# Patient Record
Sex: Female | Born: 1935 | Race: White | Hispanic: No | Marital: Married | Smoking: Never smoker
Health system: Southern US, Community
[De-identification: ages and names within clinical notes are randomized; demographics above are authoritative.]

## PROBLEM LIST (undated history)

## (undated) HISTORY — PX: ABDOMINAL HYSTERECTOMY: SHX81

## (undated) HISTORY — PX: ROTATOR CUFF REPAIR: SHX139

## (undated) HISTORY — PX: CHOLECYSTECTOMY: SHX55

## (undated) HISTORY — PX: REPLACEMENT TOTAL KNEE: SUR1224

---

## 2001-01-03 ENCOUNTER — Other Ambulatory Visit: Admission: RE | Admit: 2001-01-03 | Discharge: 2001-01-03 | Payer: Self-pay | Admitting: Obstetrics and Gynecology

## 2001-01-05 ENCOUNTER — Encounter: Payer: Self-pay | Admitting: Obstetrics and Gynecology

## 2001-01-05 ENCOUNTER — Encounter: Admission: RE | Admit: 2001-01-05 | Discharge: 2001-01-05 | Payer: Self-pay | Admitting: Obstetrics and Gynecology

## 2001-02-15 ENCOUNTER — Encounter (INDEPENDENT_AMBULATORY_CARE_PROVIDER_SITE_OTHER): Payer: Self-pay

## 2001-02-15 ENCOUNTER — Observation Stay (HOSPITAL_COMMUNITY): Admission: RE | Admit: 2001-02-15 | Discharge: 2001-02-16 | Payer: Self-pay | Admitting: Obstetrics and Gynecology

## 2001-05-16 ENCOUNTER — Encounter: Payer: Self-pay | Admitting: Orthopedic Surgery

## 2001-05-16 ENCOUNTER — Encounter: Admission: RE | Admit: 2001-05-16 | Discharge: 2001-05-16 | Payer: Self-pay | Admitting: Orthopedic Surgery

## 2002-01-07 ENCOUNTER — Other Ambulatory Visit: Admission: RE | Admit: 2002-01-07 | Discharge: 2002-01-07 | Payer: Self-pay | Admitting: Obstetrics and Gynecology

## 2003-04-08 ENCOUNTER — Other Ambulatory Visit: Admission: RE | Admit: 2003-04-08 | Discharge: 2003-04-08 | Payer: Self-pay | Admitting: Obstetrics and Gynecology

## 2003-12-08 ENCOUNTER — Encounter: Admission: RE | Admit: 2003-12-08 | Discharge: 2003-12-08 | Payer: Self-pay | Admitting: Orthopedic Surgery

## 2003-12-10 ENCOUNTER — Ambulatory Visit (HOSPITAL_COMMUNITY): Admission: RE | Admit: 2003-12-10 | Discharge: 2003-12-10 | Payer: Self-pay | Admitting: Orthopedic Surgery

## 2003-12-10 ENCOUNTER — Ambulatory Visit (HOSPITAL_BASED_OUTPATIENT_CLINIC_OR_DEPARTMENT_OTHER): Admission: RE | Admit: 2003-12-10 | Discharge: 2003-12-10 | Payer: Self-pay | Admitting: Orthopedic Surgery

## 2003-12-10 ENCOUNTER — Encounter (INDEPENDENT_AMBULATORY_CARE_PROVIDER_SITE_OTHER): Payer: Self-pay | Admitting: Specialist

## 2004-04-26 ENCOUNTER — Other Ambulatory Visit: Admission: RE | Admit: 2004-04-26 | Discharge: 2004-04-26 | Payer: Self-pay | Admitting: Obstetrics and Gynecology

## 2005-06-17 ENCOUNTER — Other Ambulatory Visit: Admission: RE | Admit: 2005-06-17 | Discharge: 2005-06-17 | Payer: Self-pay | Admitting: Obstetrics and Gynecology

## 2006-07-26 ENCOUNTER — Other Ambulatory Visit: Admission: RE | Admit: 2006-07-26 | Discharge: 2006-07-26 | Payer: Self-pay | Admitting: Obstetrics and Gynecology

## 2007-07-16 ENCOUNTER — Inpatient Hospital Stay (HOSPITAL_COMMUNITY): Admission: RE | Admit: 2007-07-16 | Discharge: 2007-07-19 | Payer: Self-pay | Admitting: Orthopedic Surgery

## 2007-11-13 ENCOUNTER — Other Ambulatory Visit: Admission: RE | Admit: 2007-11-13 | Discharge: 2007-11-13 | Payer: Self-pay | Admitting: Obstetrics and Gynecology

## 2010-01-05 ENCOUNTER — Ambulatory Visit (HOSPITAL_BASED_OUTPATIENT_CLINIC_OR_DEPARTMENT_OTHER): Admission: RE | Admit: 2010-01-05 | Discharge: 2010-01-05 | Payer: Self-pay | Admitting: Orthopedic Surgery

## 2011-02-28 LAB — POCT HEMOGLOBIN-HEMACUE: Hemoglobin: 13.7 g/dL (ref 12.0–15.0)

## 2011-04-26 NOTE — Op Note (Signed)
NAME:  Tina Espinoza, Tina Espinoza            ACCOUNT NO.:  [id number]   MEDICAL RECORD NO.:  [id number]          PATIENT TYPE:  INP   LOCATION:  0003                         FACILITY:  [facility]   PHYSICIAN:  John L. Rendall, M.D.  DATE OF BIRTH:  03/18/1918   DATE OF PROCEDURE:  07/16/2007  DATE OF DISCHARGE:                               OPERATIVE REPORT   PREOPERATIVE DIAGNOSIS:  Osteoarthritis, right knee with significant  varus deformity.   SURGICAL PROCEDURES:  Right total knee arthroplasty with computer  navigation assistance.   POSTOPERATIVE DIAGNOSIS:  Osteoarthritis, right knee with significant  varus deformity.   SURGEON:  John L. Rendall, M.D.   ASSISTANT:  Rexene Edison, [facility]   ANESTHESIA:  General with femoral nerve block.   PATHOLOGY:  The patient has bone against bone medial compartment, right  knee with chronic pain unremitting despite conservative measures.  She  has had pain to the point where it interferes with activities of daily  living and she has 7 degrees of varus deformity determined at surgery.   PROCEDURE:  Under general anesthesia the right leg was prepared with  DuraPrep and draped as a sterile field with sterile tourniquet on the  proximal thigh.  Leg was wrapped with the Esmarch and the tourniquet is  elevated at 300 mm.  Midline incision is made, carried medial  parapatellar deep, the patella was everted.  The femur was sized at a  standard.  The knee is debrided in preparation for computer mapping.  Schanz pins were then placed in the proximal and medial tibia and the  distal medial femur  At this point the arrays were set up, femoral head  and malleoli of the ankles were identified.  Mapping of the proximal  tibia and distal femur is then completed.  Varus deformity of 7 degrees  is documented.  Full extension is possible.  Using the first guide the  proximal tibial resection was carried out within 1 degree of anatomic  accuracy.  Tensioning is then done and  tensioning is used to get the  knee ligaments within 1 degree of anatomic accuracy.  Once this was  completed, anterior and posterior flare of the distal femur are resected  without notching.  Distal femoral cut was made.  The ligaments were  balanced at approximately 12.5 mm.  Lamina spreader was then inserted  and remnants of the cruciates and the menisci were resected plus spurs  were taken off on the back of the femoral condyle.  Attention was then  turned to the recessing guide and once this was completed, the proximal  tibia was exposed.  Center peg hole with keel is inserted for a #2.  Trial reduction then of a #2 tibia with the 12.5 bearing and standard  femoral component reveals excellent fit, alignment and stability.  Patella was then osteotomized and three peg patella was inserted.  Range  of motion is good with the trial components in place anatomic alignment  is within 0.5 degrees of anatomic perfection and within 0.5 mm of total  balance in flexion at 90 degrees.  Stability of the knee ligaments  is  excellent just with two towel clips on the capsule.  At this point  permanent components were obtained and cemented in place after  irrigation of the joint surfaces.  The synovectomy was carried out while  the cement hardened.  The tourniquet is let down at about an hour and  five minutes.  Multiple  small vessels were cauterized, excess cement was removed.  The knee was  then closed in layers with #1 Tycron, #1 Vicryl, 2-0 Vicryl and skin  clips.  Total operative time about an hour and 20 minutes.  The patient  returned to recovery in good condition.  Excellent position and  alignment of the leg was felt to have been obtained.      John L. Rendall, M.D.  Electronically Signed     JLR/MEDQ  D:  07/16/2007  T:  07/16/2007  Job:  [id number]

## 2011-04-29 NOTE — Discharge Summary (Signed)
[facility]  Patient:    Tina Espinoza, Tina Espinoza                   MRN: [id number] Adm. Date:  [id number] Disc. Date: [id number] Attending:  Wandalee Ferdinand                           Discharge Summary  DISCHARGE DIAGNOSES:          Symptomatic cystocele.  PROCEDURE:                    Anterior colporrhaphy.  CONSULTATIONS:                None.  DISCHARGE MEDICATIONS:        Analgesics.  HISTORY AND PHYSICAL:         This is an 77 year old gravida 5, para 3 who presented complaining of symptoms of pelvic relaxation.  She was ultimately found to have a symptomatic cystocele and minimal rectocele.  She was hence brought in for repair.  HOSPITAL COURSE:              Patient was admitted to [facility] on February 15, 2001.  On the same day she was taken to the operating room and underwent anterior colporrhaphy and suprapubic catheter placement. The procedure was uncomplicated.  Her postoperative course was benign and she was discharged on the first postoperative day afebrile and in satisfactory condition.  LABORATORIES:                 Hemoglobin and hematocrit on admission were 14.1 and 40.2 respectively.  Repeat values obtained February 16, 2001 revealed hemoglobin 10.5, hematocrit 30.8 respectively.  Routine chemistries were negative.  Type and Rh was done but the results are equivocal in the chart. Urinalysis was negative.  DISPOSITION:                  Patient is to return to [facility] in four to six weeks for postoperative evaluation. DD:  04/02/01 TD:  04/02/01 Job: [id number]

## 2011-04-29 NOTE — Op Note (Signed)
[facility]  Patient:    Tina Espinoza, Tina Espinoza                   MRN: [id number] Proc. Date: 02/15/01 Adm. Date:  [id number] Attending:  Wandalee Ferdinand                           Operative Report  PREOPERATIVE DIAGNOSIS:       Fourth degree cystocele.  POSTOPERATIVE DIAGNOSIS:      Fourth degree cystocele.  OPERATION:                    1. Anterior colporrhaphy.                               2. Placement of suprapubic catheter.  SURGEON:                      Rudy Jew. Ashley Royalty, M.D.  ASSISTANT:                    Georgina Peer, M.D.  ANESTHESIA:                   General.  ESTIMATED BLOOD LOSS:         100 cc.  COMPLICATIONS:                None.  PACKS AND DRAINS:             Vaginal pack and suprapubic catheter.  DESCRIPTION OF PROCEDURE:     The patient was taken to the operating room and placed in the dorsal supine position.  After adequate general anesthesia was administered, she was placed in the lithotomy position and prepped and draped in the usual manner for vaginal surgery.  Foley catheter was placed. Posterior weighted retractor was placed per vagina, and the apex of the vagina was identified and grasped with Allis clamps.  The patient was noted to have a fourth degree cystocele and virtually no rectocele at present.  The vaginal apex seemed reasonably well supported.  The Allis clamps were moved inferiorly and the vaginal mucosa between them incised longitudinally to within approximately 2 cm from the urethral meatus.  The mucosa was then separated from the underlying pubovesical fascia in order to release the cystocele medially for later imbrication.  This was accomplished bilaterally without difficulty.  Dilute methylene blue was instilled into the Foley catheter to make sure that the bladder was in fact intact.  There was no spillage whatsoever.  The cystocele was then imbricated with 2-0 Vicryl in pursestring fashion  and later using interrupted sutures.  The excess vaginal mucosa was excised and submitted to pathology for histologic studies.  The mucosa was then closed with 2-0 Vicryl in a running locking fashion.  Careful attention was paid to pick up some pubovesical fascia in the same bites to help obliterate dead space.  Hemostasis was noted.  Next, the bladder was filled with sterile water via the Foley catheter.  A Bonnano suprapubic catheter was introduced into the bladder and sutured in place with 3-0 nylon sutures.  The Foley catheter was removed and the vagina packed with 1 inch Iodoform gauze.  At this point, hemostasis was noted and the procedure terminated.  The patient tolerated the procedure extremely well and was returned to the recovery room in good  condition. DD:  02/15/01 TD:  02/16/01 Job: [id number]

## 2011-04-29 NOTE — Discharge Summary (Signed)
NAME:  BRODY, KUMP            ACCOUNT NO.:  [id number]   MEDICAL RECORD NO.:  [id number]          PATIENT TYPE:  INP   LOCATION:  1606                         FACILITY:  [facility]   PHYSICIAN:  John L. Rendall, M.D.  DATE OF BIRTH:  04-20-1918   DATE OF ADMISSION:  07/16/2007  DATE OF DISCHARGE:  07/19/2007                               DISCHARGE SUMMARY   ADMITTING DIAGNOSES:  1. End-stage osteoarthritis.  2. Dyslipidemia.   DISCHARGE DIAGNOSES:  1. Status post right total knee arthroplasty.  2. Dyslipidemia.  3. Acute blood loss anemia requiring blood transfusion.  4. Hypoxia secondary to acute blood loss anemia.   HISTORY OF PRESENT ILLNESS:  Ms. Hernan is an 77 year old white  female with right knee pain for approximately 8-12 years.  No injuries,  no surgery.  Constant pain.  Has failed conservative treatment.  Worse  with ambulation, mechanical symptoms, positive for giving way.  No  assistive device.  The patient does have waking pain.  The patient was  admitted to undergo right total knee arthroplasty with computer  navigation.   MEDICATIONS:  1. Estrace 1 mg two to three times a week, taken last July 12, 2007.  2. Simvastatin 20 mg one q.p.m.  3. Tramadol one p.o. q.p.m.  4. Darvocet-N 100 one q.4-6h. p.r.n.  5. Aspirin 81 mg one daily, stopped July 12, 2007.  6. Centrum Silver one daily.  7. Multivitamin one daily.  8. MiraLax or Metamucil daily.   ALLERGIES:  1. VIOXX.  2. LYRICA.  3. FLONASE.  4. MOXIFLOXACIN.   SURGICAL PROCEDURE:  The patient was taken to the operating room on  July 16, 2007, by Dr. Erasmo Leventhal, assisted by Rexene Edison, PA-C.  The  patient was placed under general anesthesia, received supplemental  femoral nerve block and then underwent a right total knee arthroplasty  with computer navigation.  The following components were inserted:  A  size #2 tibial tray; standard right femoral component; a metal-back  patella, standard; and a  12.5-mm poly insert.  The patient tolerated the  procedure well and returned to recovery in good stable condition.   CONSULTS:  The following consults were obtained while the patient was  hospitalized:  PT, OT, case management.   HOSPITAL COURSE:  Postoperative day #1 the patient afebrile, vital signs  were stable.  Denied chest pain, shortness of breath, nausea, vomiting.  H&H was 9.2 and 26.5.  Repeat CBC was ordered and later that day H&H was  8.9 and 25.5.  Postoperative day #2, the patient afebrile and vital  signs stable.  Unable to wean off oxygen secondary to patient  desaturating on room air.  The patient's H&H was 8.9 and 25.5, was given  1 unit of packed red blood cells.  Postoperative day #3, the patient  afebrile, vital signs were stable.  H&H improved to 10.3 and 30.0.  Hypoxia was resolved.  The patient was 93% on room air.  The patient  progressing well with physical therapy and was later discharged home in  good, stable condition.   LABORATORIES:  Routine labs on admission:  CBC  all values within normal  limits.  Coags on admission:  PT was 12.8, INR 1.0, PTT was high at 42.  Routine chemistries on admission:  Sodium 141, potassium 5.0, chloride  103, bicarb 31, glucose was elevated at 132, BUN was 12, creatinine  0.79.  Hepatic enzymes on admission:  All values within normal limits.  UA on admission had a few bacteria and 3-6 white blood cells, epithelial  cells were few, leukocyte esterase was moderate, negative for nitrates,  negative for protein.  Repeat UA on admission dated July 16, 2007:  White blood cells 3-6, bacteria none. Urine culture on July 16, 2007,  had no growth.   DISCHARGE INSTRUCTIONS:  1. Diet:  No restrictions.  2. Wound care:  The patient to change dressing daily, call if any      signs of infection.  The patient may shower on Thursday after      discharge.  3. Activity:  The patient weightbearing as tolerated.   SPECIAL INSTRUCTIONS:  CPM  0-90 degrees 6-8 hours a day.   FOLLOWUP:  Needs followup with Dr. Priscille Kluver in the office on July 31, 2007.  The patient is to call office at [phone] for appointment.   MEDICATIONS:  1. Arixtra 2.5 mg one injection daily at 8 a.m. as instructed, last      dose July 23, 2007.  Begin baby aspirin 81 mg on July 24, 2007,      daily for 1 month.  2. Norco 5/325 one to two q.4h. as needed for pain.  3. Resume Estrace 1 mg two to three weekly.  4. Simvastatin 20 mg one p.o. q.p.m.  5. Centrum Silver one p.o. daily.  6. Multivitamin one p.o. daily.  7. MiraLax or Metamucil daily as needed.   CONDITION ON DISCHARGE:  The patient was in good, stable condition at  time of discharge to home.      Richardean Canal, P.A.      John L. Rendall, M.D.  Electronically Signed    GC/MEDQ  D:  07/31/2007  T:  07/31/2007  Job:  [id number]

## 2011-04-29 NOTE — Op Note (Signed)
NAME:  Tina Espinoza, Tina Espinoza                      ACCOUNT NO.:  [id number]   MEDICAL RECORD NO.:  [id number]                   PATIENT TYPE:  AMB   LOCATION:  DSC                                  FACILITY:  [facility]   PHYSICIAN:  Cindee Salt, M.D.                    DATE OF BIRTH:  03-18-1918   DATE OF PROCEDURE:  12/10/2003  DATE OF DISCHARGE:                                 OPERATIVE REPORT   PREOPERATIVE DIAGNOSIS:  Mucoid cyst, left middle finger.   POSTOPERATIVE DIAGNOSIS:  Mucoid cyst, left middle finger.   OPERATION:  Excision of mucoid cyst, debridement of distal interphalangeal  joint, left middle finger.   SURGEON:  Cindee Salt, M.D.   ASSISTANT:  Carolyne Fiscal   ANESTHESIA:  Forearm-based IV regional.   HISTORY:  The patient is an 77 year old female with a history of a mucoid  cyst, left middle finger.  This has been present for a considerable period  of time with a large groove of her nail plate to the distal margin.   PROCEDURE:  The patient was brought to the operating room where a forearm-  based IV regional anesthetic was carried out without difficulty.  She was  prepped using Duraprep, supine position, left arm free.  A curvilinear  incision was made over the distal interphalangeal joint, carried down  through subcutaneous tissue.  Bleeders were electrocauterized.  The cystic  structure was immediately apparent on the ulnar aspect, distal  interphalangeal joint.  This was traced distally beneath the skin, removing  a large cystic mass.  A partially deflated mass was present distally.  Care  was taken to protect the nail matrix.  The joint was opened.  A debridement  was performed of small osteophytes primarily on the ulnar aspect.   The wound was copiously irrigated with saline and skin closed with  interrupted 5-0 nylon sutures.  Specimen was sent to pathology.  A sterile  compressive dressing and splint for the finger were applied.  The patient  tolerated the procedure  well and was taken to the recovery room for  observation in satisfactory condition.  She is discharged home to return to  the [facility] [place] in one week on Vicodin and Septra-DS.                                               Cindee Salt, M.D.    GK/MEDQ  D:  12/10/2003  T:  12/10/2003  Job:  [id number]

## 2011-04-29 NOTE — H&P (Signed)
[facility]  Patient:    Tina Espinoza                     MRN: [id number] Adm. Date:  02/26/01 Attending:  Guy Sandifer. Arleta Creek, M.D.                         History and Physical  CHIEF COMPLAINT:              Uterine fibroids.  HISTORY OF PRESENT ILLNESS:   This patient is a 77 year old, married, black female, G3, P3, status post tubal ligation, who has had known uterine leiomyomata over the last 2 years.  However, she has had increasingly heavy flows and has recently been diagnosed with anemia which was treated by Dr. Catha Espinoza.  On examination, the uterus is approximately 14 weeks in size and irregular in contour.  Pelvic ultrasound reveals the uterus measuring 15 x 9 x 7.2 cm, consistent with uterine leiomyomata.  Ovaries not visualized bilaterally. After a careful discussion of the options, the patient is scheduled for total abdominal hysterectomy and removal of an ovary only if distinctly abnormal.  PAST MEDICAL HISTORY:         1. The patient has twins.                               2. Chronic hypertension.                               3. Chronic proteinuria.  OBSTETRIC HISTORY:            Cesarean section x 2.  TAB x 1.  MEDICATIONS:                  1. Maxzide daily.                               2. Allegra p.r.n.                               3. Iron and vitamins daily.  ALLERGIES:                    No known drug allergies.  SOCIAL HISTORY:               The patient denies tobacco, alcohol, or drug abuse.  FAMILY HISTORY:               Type 2 diabetes in mother and father.  Chronic hypertension in mother.  REVIEW OF SYSTEMS:            Negative except as above.  PHYSICAL EXAMINATION:  GENERAL:                      Height 5 feet zero inches.  Weight 191 pounds.  VITAL SIGNS:                  Blood pressure 140/90.  HEENT:                        Without thyromegaly.  LUNGS:  Clear to  auscultation.  HEART:                        Regular rate and rhythm.  BACK:                         No CVA tenderness.  BREASTS:                      Without mass, retraction, or discharge.  ABDOMEN:                      Obese, soft with a well-healed infraumbilical midline scar and a pelvic mass arising from the midline in the pelvis to approximately one-half the distance to the umbilicus.  PELVIC:                       Vulva, vagina, and cervix without lesion. Uterus is 14 weeks in size, irregular in contour, mobile, nontender. Adnexal exam is essentially completely compromised.  EXTREMITIES:                  Grossly within normal limits.  NEUROLOGIC:                   Grossly within normal limits.  ASSESSMENT:                   Uterine leiomyomata.  PLAN:                         Total abdominal hysterectomy; removal of an ovary if abnormal. DD:  02/14/01 TD:  02/14/01 Job: [id number]

## 2011-09-26 LAB — CBC
HCT: 25.5 — ABNORMAL LOW
Hemoglobin: 10.3 — ABNORMAL LOW
Hemoglobin: 8.9 — ABNORMAL LOW
Hemoglobin: 9.2 — ABNORMAL LOW
MCHC: 34.8
MCHC: 34.8
MCV: 90.8
Platelets: 245
RBC: 2.81 — ABNORMAL LOW
RBC: 2.95 — ABNORMAL LOW
RBC: 3.32 — ABNORMAL LOW
RDW: 12.6
RDW: 13.7
RDW: 11.9
WBC: 5.5

## 2011-09-26 LAB — BASIC METABOLIC PANEL
CO2: 29
CO2: 30
Calcium: 7.6 — ABNORMAL LOW
Calcium: 7.7 — ABNORMAL LOW
Calcium: 8 — ABNORMAL LOW
Creatinine, Ser: 0.64
Creatinine, Ser: 0.71
GFR calc Af Amer: >60
GFR calc Af Amer: >60
GFR calc non Af Amer: >60
GFR calc non Af Amer: >60
Glucose, Bld: 142 — ABNORMAL HIGH
Glucose, Bld: 97
Sodium: 134 — ABNORMAL LOW
Sodium: 136

## 2011-09-26 LAB — URINALYSIS, ROUTINE W REFLEX MICROSCOPIC
Bilirubin Urine: NEGATIVE
Hgb urine dipstick: NEGATIVE
Nitrite: NEGATIVE
Nitrite: NEGATIVE
Specific Gravity, Urine: 1.021
Specific Gravity, Urine: 1.019
Urobilinogen, UA: 0.2
Urobilinogen, UA: 0.2

## 2011-09-26 LAB — URINE CULTURE
Colony Count: NO GROWTH
Colony Count: NO GROWTH
Culture: NO GROWTH
Culture: NO GROWTH
Special Requests: NEGATIVE

## 2011-09-26 LAB — DIFFERENTIAL
Basophils Absolute: 0
Basophils Relative: 1
Eosinophils Relative: 3
Lymphocytes Relative: 32
Monocytes Absolute: 0.5
Monocytes Relative: 9
Neutro Abs: 3

## 2011-09-26 LAB — COMPREHENSIVE METABOLIC PANEL
AST: 22
Albumin: 3.5
Alkaline Phosphatase: 44
BUN: 12
Chloride: 103
GFR calc Af Amer: >60
Potassium: 5
Total Bilirubin: 0.4
Total Protein: 6.5

## 2011-09-26 LAB — URINE MICROSCOPIC-ADD ON

## 2011-09-26 LAB — CROSSMATCH

## 2011-09-26 LAB — APTT: aPTT: 42 — ABNORMAL HIGH

## 2011-09-26 LAB — ABO/RH: ABO/RH(D): A POS

## 2017-04-20 ENCOUNTER — Emergency Department (HOSPITAL_COMMUNITY)
Admission: EM | Admit: 2017-04-20 | Discharge: 2017-04-21 | Disposition: A | Discharge status: Home / Self Care | Payer: Medicare Other | Attending: Emergency Medicine | Admitting: Emergency Medicine

## 2017-04-20 ENCOUNTER — Encounter (HOSPITAL_COMMUNITY): Payer: Self-pay | Admitting: Emergency Medicine

## 2017-04-20 DIAGNOSIS — K224 Dyskinesia of esophagus: Secondary | ICD-10-CM | POA: Diagnosis not present

## 2017-04-20 DIAGNOSIS — R112 Nausea with vomiting, unspecified: Secondary | ICD-10-CM

## 2017-04-20 DIAGNOSIS — K2970 Gastritis, unspecified, without bleeding: Secondary | ICD-10-CM | POA: Insufficient documentation

## 2017-04-20 DIAGNOSIS — Z96653 Presence of artificial knee joint, bilateral: Secondary | ICD-10-CM | POA: Diagnosis not present

## 2017-04-20 DIAGNOSIS — K219 Gastro-esophageal reflux disease without esophagitis: Secondary | ICD-10-CM | POA: Insufficient documentation

## 2017-04-20 DIAGNOSIS — R1013 Epigastric pain: Secondary | ICD-10-CM | POA: Diagnosis not present

## 2017-04-20 DIAGNOSIS — R1110 Vomiting, unspecified: Secondary | ICD-10-CM | POA: Diagnosis present

## 2017-04-20 LAB — CBC WITH DIFFERENTIAL/PLATELET
Basophils Absolute: 0 10*3/uL (ref 0.0–0.1)
Basophils Relative: 0 %
EOS PCT: 2 %
Eosinophils Absolute: 0.1 10*3/uL (ref 0.0–0.7)
HCT: 42.8 % (ref 36.0–46.0)
Hemoglobin: 13.8 g/dL (ref 12.0–15.0)
LYMPHS ABS: 1.1 10*3/uL (ref 0.7–4.0)
Lymphocytes Relative: 13 %
MCH: 29.9 pg (ref 26.0–34.0)
MCHC: 32.2 g/dL (ref 30.0–36.0)
MCV: 92.8 fL (ref 78.0–100.0)
MONO ABS: 0.5 10*3/uL (ref 0.1–1.0)
MONOS PCT: 6 %
Neutro Abs: 6.6 10*3/uL (ref 1.7–7.7)
Neutrophils Relative %: 79 %
PLATELETS: 227 10*3/uL (ref 150–400)
RBC: 4.61 MIL/uL (ref 3.87–5.11)
RDW: 13.1 % (ref 11.5–15.5)
WBC: 8.4 10*3/uL (ref 4.0–10.5)

## 2017-04-20 LAB — URINALYSIS, ROUTINE W REFLEX MICROSCOPIC
Bilirubin Urine: NEGATIVE
Glucose, UA: NEGATIVE mg/dL
Hgb urine dipstick: NEGATIVE
Ketones, ur: 20 mg/dL — AB
Nitrite: NEGATIVE
Protein, ur: 30 mg/dL — AB
Specific Gravity, Urine: 1.023 (ref 1.005–1.030)
pH: 5 (ref 5.0–8.0)

## 2017-04-20 LAB — COMPREHENSIVE METABOLIC PANEL
ALK PHOS: 41 U/L (ref 38–126)
ALT: 13 U/L — ABNORMAL LOW (ref 14–54)
ANION GAP: 10 (ref 5–15)
AST: 22 U/L (ref 15–41)
Albumin: 4.1 g/dL (ref 3.5–5.0)
BUN: 18 mg/dL (ref 6–20)
CALCIUM: 9.4 mg/dL (ref 8.9–10.3)
CHLORIDE: 103 mmol/L (ref 101–111)
CO2: 26 mmol/L (ref 22–32)
Creatinine, Ser: 0.97 mg/dL (ref 0.44–1.00)
GFR calc non Af Amer: 48 mL/min — ABNORMAL LOW (ref >60)
GFR, EST AFRICAN AMERICAN: 55 mL/min — AB (ref >60)
Glucose, Bld: 132 mg/dL — ABNORMAL HIGH (ref 65–99)
POTASSIUM: 3.8 mmol/L (ref 3.5–5.1)
Sodium: 139 mmol/L (ref 135–145)
Total Bilirubin: 0.4 mg/dL (ref 0.3–1.2)
Total Protein: 7.2 g/dL (ref 6.5–8.1)

## 2017-04-20 LAB — LIPASE, BLOOD: Lipase: 24 U/L (ref 11–51)

## 2017-04-20 NOTE — ED Triage Notes (Signed)
Pt c/o vomiting since 12pm today.  Pt st's she is not having any nausea but has epigastric pain prior to vomiting and then pain stops.  Pt st's unable to keep anything down

## 2017-04-20 NOTE — ED Provider Notes (Signed)
[facility]-EMERGENCY DEPT Provider Note   CSN: [id number] Arrival date & time: 04/20/17  1955     History   Chief Complaint Chief Complaint  Patient presents with  . Emesis    HPI Tina Espinoza is a 83 y.o. female with a PMHx of hiatal hernia, GERD, esophageal spasm, HTN, HLD, CKD3, lumbar spinal stenosis, and osteoarthritis, and PSHx of cholecystectomy and hysterectomy, who presents to the ED with complaints of nausea and vomiting that occurred around lunch time at approximately 12:30 PM today. Patient states that she was eating soup with noodles and a small piece of chicken and after approximately 4-5 by she began throwing up. She states something similar has happened in the past, ~3 times total, and her PCP last year suspected that it was due to esophageal spasm. He stated however that if it continued to occur she would need to consider having an EGD done. She has not had this done yet. She states she's had approximately 12 episodes of nonbloody nonbilious emesis consisting of undigested food and clear liquid, the last emesis was around 7:15 PM. Patient and her family state that they could hear gurgling in her throat right before she vomited. After vomiting she developed some epigastric pain that she describes as initially 10/10 although now resolved, intermittent throbbing nonradiating epigastric pain worse with vomiting and with no treatments tried prior to arrival. She was seen at [facility] urgent care in [place], and the physician (Dr. Earlene Plater) recommended that she come here for further evaluation. Denies sensation of food impaction.   She denies fevers, chills, CP, SOB, diarrhea/constipation, obstipation, melena, hematochezia, hematemesis, hematuria, dysuria, myalgias, arthralgias, numbness, tingling, focal weakness, or any other complaints at this time. Denies recent travel, sick contacts, suspicious food intake, EtOH use, or regular NSAID use. Doesn't take zantac regularly.    The  history is provided by the patient and medical records. No language interpreter was used.  Emesis   This is a new problem. The current episode started 6 to 12 hours ago. The problem occurs more than 10 times per day. The problem has been resolved. The emesis has an appearance of stomach contents. There has been no fever. Associated symptoms include abdominal pain. Pertinent negatives include no arthralgias, no chills, no diarrhea, no fever and no myalgias.    History reviewed. No pertinent past medical history.  There are no active problems to display for this patient.   Past Surgical History:  Procedure Laterality Date  . ABDOMINAL HYSTERECTOMY    . CHOLECYSTECTOMY    . REPLACEMENT TOTAL KNEE    . ROTATOR CUFF REPAIR      OB History    No data available       Home Medications    Prior to Admission medications   Not on File    Family History No family history on file.  Social History Social History  Substance Use Topics  . Smoking status: Never Smoker  . Smokeless tobacco: Never Used  . Alcohol use No     Allergies   Patient has no allergy information on record.   Review of Systems Review of Systems  Constitutional: Negative for chills and fever.  Respiratory: Negative for shortness of breath.   Cardiovascular: Negative for chest pain.  Gastrointestinal: Positive for abdominal pain, nausea and vomiting. Negative for blood in stool, constipation and diarrhea.  Genitourinary: Negative for dysuria and hematuria.  Musculoskeletal: Negative for arthralgias and myalgias.  Skin: Negative for color change.  Allergic/Immunologic: Negative  for immunocompromised state.  Neurological: Negative for weakness and numbness.  Psychiatric/Behavioral: Negative for confusion.   All other systems reviewed and are negative for acute change except as noted in the HPI.    Physical Exam Updated Vital Signs BP (!) 164/90 (BP Location: Left Wrist)   Pulse 98   Temp 98.1 F (36.7  C) (Oral)   Resp 17   Ht 5\' 2"  (1.575 m)   Wt 59.6 kg   SpO2 96%   BMI 24.03 kg/m   Physical Exam  Constitutional: She is oriented to person, place, and time. Vital signs are normal. She appears well-developed and well-nourished.  Non-toxic appearance. No distress.  Afebrile, nontoxic, NAD, appears much younger than stated age  HENT:  Head: Normocephalic and atraumatic.  Mouth/Throat: Oropharynx is clear and moist and mucous membranes are normal.  Eyes: Conjunctivae and EOM are normal. Right eye exhibits no discharge. Left eye exhibits no discharge.  Neck: Normal range of motion. Neck supple.  Cardiovascular: Normal rate, regular rhythm, normal heart sounds and intact distal pulses.  Exam reveals no gallop and no friction rub.   No murmur heard. Pulmonary/Chest: Effort normal and breath sounds normal. No respiratory distress. She has no decreased breath sounds. She has no wheezes. She has no rhonchi. She has no rales.  Abdominal: Soft. Normal appearance and bowel sounds are normal. She exhibits no distension. There is tenderness in the right upper quadrant, epigastric area and left upper quadrant. There is no rigidity, no rebound, no guarding, no CVA tenderness, no tenderness at McBurney's point and negative Murphy's sign.  Soft, nondistended, +BS throughout, with very mild upper abd TTP, no r/g/r, neg murphy's, neg mcburney's, no CVA TTP   Musculoskeletal: Normal range of motion.  Neurological: She is alert and oriented to person, place, and time. She has normal strength. No sensory deficit.  Skin: Skin is warm, dry and intact. No rash noted.  Psychiatric: She has a normal mood and affect.  Nursing note and vitals reviewed.    ED Treatments / Results  Labs (all labs ordered are listed, but only abnormal results are displayed) Labs Reviewed  COMPREHENSIVE METABOLIC PANEL - Abnormal; Notable for the following:       Result Value   Glucose, Bld 132 (*)    ALT 13 (*)    GFR calc  non Af Amer 48 (*)    GFR calc Af Amer 55 (*)    All other components within normal limits  URINALYSIS, ROUTINE W REFLEX MICROSCOPIC - Abnormal; Notable for the following:    APPearance HAZY (*)    Ketones, ur 20 (*)    Protein, ur 30 (*)    Leukocytes, UA SMALL (*)    Bacteria, UA RARE (*)    Squamous Epithelial / LPF 0-5 (*)    All other components within normal limits  URINE CULTURE  CBC WITH DIFFERENTIAL/PLATELET  LIPASE, BLOOD  I-STAT TROPOININ, ED    EKG  EKG Interpretation  Date/Time:  Friday Apr 21 2017 00:40:19 EDT Ventricular Rate:  102 PR Interval:    QRS Duration: 147 QT Interval:  363 QTC Calculation: 473 R Axis:   -117 Text Interpretation:  Sinus tachycardia RBBB and LAFB No acute changes No significant change since last tracing Confirmed by Derwood Kaplan [phone]) on 04/21/2017 2:55:12 AM       Radiology Ct Abdomen Pelvis W Contrast  Result Date: 04/21/2017 CLINICAL DATA:  83 year old with upper abdominal pain, nausea and vomiting. EXAM: CT ABDOMEN AND PELVIS  WITH CONTRAST TECHNIQUE: Multidetector CT imaging of the abdomen and pelvis was performed using the standard protocol following bolus administration of intravenous contrast. CONTRAST:  ISOVUE-300 IOPAMIDOL (ISOVUE-300) INJECTION 61% COMPARISON:  None. FINDINGS: Lower chest: Mild multi chamber cardiomegaly. Tortuosity and atherosclerosis of the descending thoracic aorta. Multifocal atelectasis at the lung bases, most significant dependently. No pleural fluid. Hepatobiliary: A few tiny a subcentimeter hepatic hypodensities are too small to characterize but likely small cysts. Postcholecystectomy without biliary dilatation. Pancreas: Parenchymal atrophy. No ductal dilatation or inflammation. Spleen: Normal in size without focal abnormality. Adrenals/Urinary Tract: No adrenal nodule. No hydronephrosis or perinephric edema. Homogeneous enhancement and symmetric excretion on delayed phase imaging. Urinary bladder  is physiologically distended. Stomach/Bowel: The stomach is decompressed. No bowel dilatation to suggest obstruction. No bowel wall thickening or inflammation. Small stool burden throughout the colon. The appendix is not visualized, no pericecal or right lower quadrant inflammation. Vascular/Lymphatic: Aortic and branch atherosclerosis and tortuosity. No aneurysm. No acute vascular findings. Small perigastric node in the upper abdomen measure 7 mm image 14 series 4. No retroperitoneal, mesenteric or pelvic adenopathy. Reproductive: Status post hysterectomy. No adnexal masses. Other: No free air, free fluid, or intra-abdominal fluid collection. Tiny fat containing umbilical hernia without inflammation. Musculoskeletal: Moderate scoliosis and associated degenerative change in the spine. There are no acute or suspicious osseous abnormalities. IMPRESSION: 1. No acute abnormality or explanation for patient's symptoms. 2. Postcholecystectomy and hysterectomy. 3. Aortic and branch atherosclerosis without aneurysm. Electronically Signed   By: Rubye Oaks M.D.   On: 04/21/2017 01:47    Procedures Procedures (including critical care time)  Medications Ordered in ED Medications  ondansetron (ZOFRAN) injection 4 mg (4 mg Intravenous Given 04/21/17 0024)  famotidine (PEPCID) IVPB 20 mg premix (20 mg Intravenous New Bag/Given 04/21/17 0026)  iopamidol (ISOVUE-300) 61 % injection (100 mLs  Contrast Given 04/21/17 0058)     Initial Impression / Assessment and Plan / ED Course  I have reviewed the triage vital signs and the nursing notes.  Pertinent labs & imaging results that were available during my care of the patient were reviewed by me and considered in my medical decision making (see chart for details).     83 y.o. female here with n/v that occurred this afternoon when she was eating lunch, and subsequent epigastric pain that started after; hx of similar issue, thought to have esophageal spasms. States  pain is resolved, although on exam, very mild upper abd TTP, neg murphy's, nonperitoneal. Well appearing, very pleasant. U/A with 0-5 squamous, rare bacteria, 6-30 WBCs, doubt UTI but will send for UCx. CBC w/diff unremarkable. CMP with gluc 132 but otherwise WNL. Lipase WNL. Will proceed with CT abd/pelv to r/o obstruction vs mass vs other etiology. Will get EKG and trop as well. Will give zofran and pepcid, pt declines wanting anything else, and declines GI cocktail. Will reassess shortly.   2:55 AM EKG unchanged from prior, no acute ischemic findings. Trop negative. CT with no acute findings, which is reassuring. Pt feeling better, will PO challenge and if she's able to tolerate PO then we can get her home, likely start back on zantac and give rx for zofran, advised that she ultimately likely needs to see a GI specialist possibly for EGD. Discussed diet modifications to help with gastritis/GERD, and eating small pieces of food stuffs. Will reassess after PO challenge. Discussed case with my attending Dr. Rhunette Croft who agrees with plan.   3:19 AM Pt tolerating PO well, will d/c  home with previously outlined plan. Rx zofran and zantac given. F/up with PCP and GI in 1wk for recheck and ongoing management. I explained the diagnosis and have given explicit precautions to return to the ER including for any other new or worsening symptoms. The patient understands and accepts the medical plan as it's been dictated and I have answered their questions. Discharge instructions concerning home care and prescriptions have been given. The patient is STABLE and is discharged to home in good condition.    Final Clinical Impressions(s) / ED Diagnoses   Final diagnoses:  Nausea and vomiting in adult patient  Epigastric abdominal pain  Esophageal spasm  Gastritis, presence of bleeding unspecified, unspecified chronicity, unspecified gastritis type  Gastroesophageal reflux disease, esophagitis presence not specified     New Prescriptions New Prescriptions   ONDANSETRON (ZOFRAN ODT) 4 MG DISINTEGRATING TABLET    Take 1 tablet (4 mg total) by mouth every 8 (eight) hours as needed for nausea or vomiting.   RANITIDINE (ZANTAC) 150 MG TABLET    Take 1 tablet (150 mg total) by mouth at bedtime.     [address], [place], [place] 04/21/17 0320    Derwood KaplanNanavati, Ankit, MD 04/24/17 [phone]

## 2017-04-21 ENCOUNTER — Emergency Department (HOSPITAL_COMMUNITY): Payer: Medicare Other

## 2017-04-21 LAB — I-STAT TROPONIN, ED: Troponin i, poc: 0.03 ng/mL (ref 0.00–0.08)

## 2017-04-21 MED ORDER — ONDANSETRON 4 MG PO TBDP: 4.0000 mg | ORAL_TABLET | Freq: Three times a day (TID) | ORAL | 0 refills | Status: AC | PRN

## 2017-04-21 MED ORDER — ONDANSETRON 4 MG PO TBDP: 4.0000 mg | ORAL_TABLET | Freq: Three times a day (TID) | ORAL | 0 refills | Status: DC | PRN

## 2017-04-21 MED ORDER — IOPAMIDOL (ISOVUE-300) INJECTION 61%
INTRAVENOUS | Status: AC
  Administered 2017-04-21: 100 mL
  Filled 2017-04-21: qty 100

## 2017-04-21 MED ORDER — FAMOTIDINE IN NACL 20-0.9 MG/50ML-% IV SOLN
20.0000 mg | Freq: Once | INTRAVENOUS | Status: AC
  Administered 2017-04-21: 20 mg via INTRAVENOUS
  Filled 2017-04-21: qty 50

## 2017-04-21 MED ORDER — ONDANSETRON HCL 4 MG/2ML IJ SOLN
4.0000 mg | Freq: Once | INTRAMUSCULAR | Status: AC
  Administered 2017-04-21: 4 mg via INTRAVENOUS
  Filled 2017-04-21: qty 2

## 2017-04-21 MED ORDER — RANITIDINE HCL 150 MG PO TABS: 150.0000 mg | ORAL_TABLET | Freq: Every day | ORAL | 0 refills | Status: AC

## 2017-04-21 MED ORDER — RANITIDINE HCL 150 MG PO TABS: 150.0000 mg | ORAL_TABLET | Freq: Every day | ORAL | 0 refills | Status: DC

## 2017-04-21 NOTE — ED Notes (Signed)
Pt tolerated PO intake

## 2017-04-21 NOTE — Discharge Instructions (Addendum)
Your abdominal pain is likely from esophageal spasms or possibly gastritis or an ulcer. You will need to take zantac as directed, and avoid spicy/fatty/acidic foods, avoid soda/coffee/tea/alcohol. Avoid laying down flat within 30 minutes of eating. Eat small pieces of food and make sure to chew well before swallowing. Avoid NSAIDs like ibuprofen/aleve/motrin/etc on an empty stomach. May consider using over the counter tums/maalox as needed for additional relief. Use zofran as directed as needed for nausea. Use tylenol as needed for pain. Follow up with your gastroenterologist and your regular doctor in one week for ongoing evaluation of your symptoms. Return to the ER for changes or worsening symptoms.  Abdominal (belly) pain can be caused by many things. Your caregiver performed an examination and possibly ordered blood/urine tests and imaging (CT scan, x-rays, ultrasound). Many cases can be observed and treated at home after initial evaluation in the emergency department. Even though you are being discharged home, abdominal pain can be unpredictable. Therefore, you need a repeated exam if your pain does not resolve, returns, or worsens. Most patients with abdominal pain don't have to be admitted to the hospital or have surgery, but serious problems like appendicitis and gallbladder attacks can start out as nonspecific pain. Many abdominal conditions cannot be diagnosed in one visit, so follow-up evaluations are very important. SEEK IMMEDIATE MEDICAL ATTENTION IF YOU DEVELOP ANY OF THE FOLLOWING SYMPTOMS: The pain does not go away or becomes severe.  A temperature above 101 develops.  Repeated vomiting occurs (multiple episodes).  The pain becomes localized to portions of the abdomen. The right side could possibly be appendicitis. In an adult, the left lower portion of the abdomen could be colitis or diverticulitis.  Blood is being passed in stools or vomit (bright red or black tarry stools).  Return also  if you develop chest pain, difficulty breathing, dizziness or fainting, or become confused, poorly responsive, or inconsolable (young children). The constipation stays for more than 4 days.  There is belly (abdominal) or rectal pain.  You do not seem to be getting better.

## 2017-04-22 LAB — URINE CULTURE

## 2017-12-27 IMAGING — CT CT ABD-PELV W/ CM
2 of 5 series · 16 of 46 positions shown, 18 images · IV contrast (APPLIED)
Comparison: None.

CLINICAL DATA: [AGE] with upper abdominal pain, nausea and
vomiting.

EXAM:
CT ABDOMEN AND PELVIS WITH CONTRAST
TECHNIQUE: Multidetector CT imaging of the abdomen and pelvis was performed
using the standard protocol following bolus administration of
intravenous contrast.
CONTRAST:  100mL X37V6M-SDD IOPAMIDOL (X37V6M-SDD) INJECTION 61%

[Series 4: abd/ pelvis 5.0 i30f 2 · axial · 0.80mm/px · z∈[+756,+1111]mm · 13 of 81 slices shown, 15 images]
[im 5/81  soft-tissue]
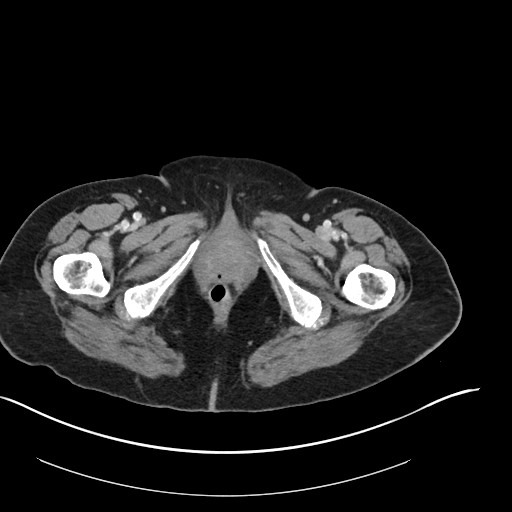
[im 5/81  bone]
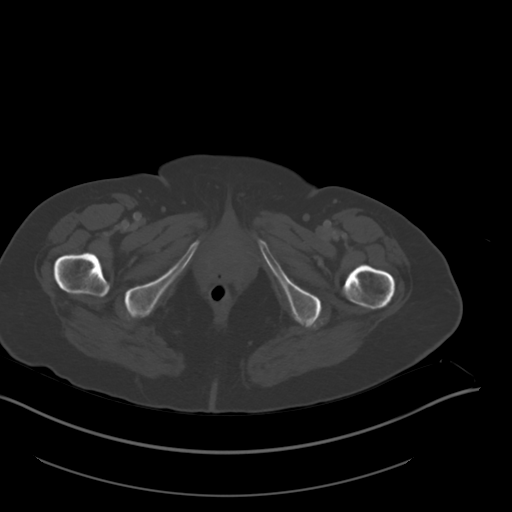
[im 9/81  soft-tissue]
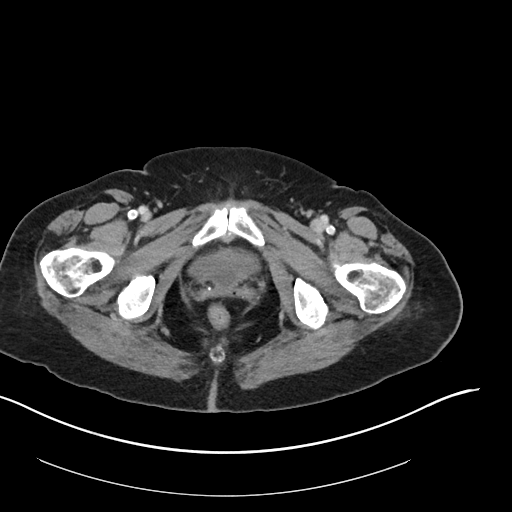
[im 18/81  soft-tissue]
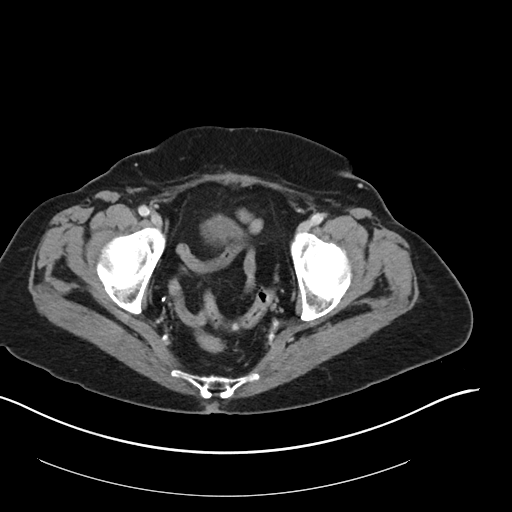
[im 23/81  soft-tissue]
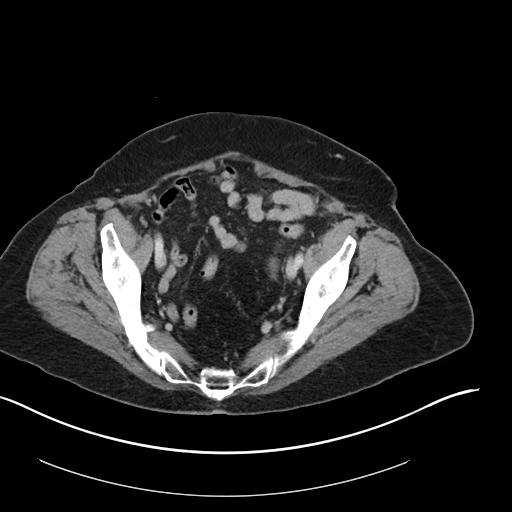
[im 27/81  soft-tissue]
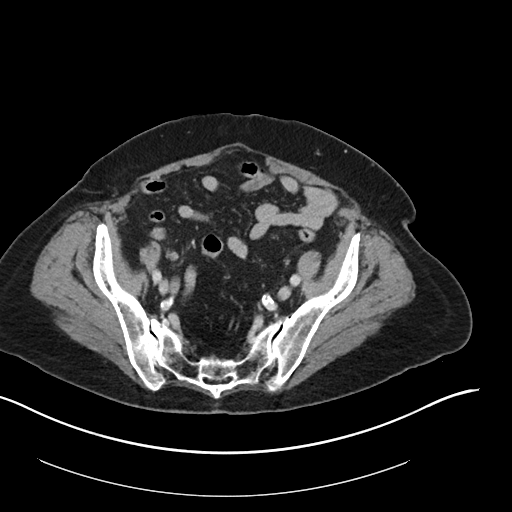
[im 36/81  soft-tissue]
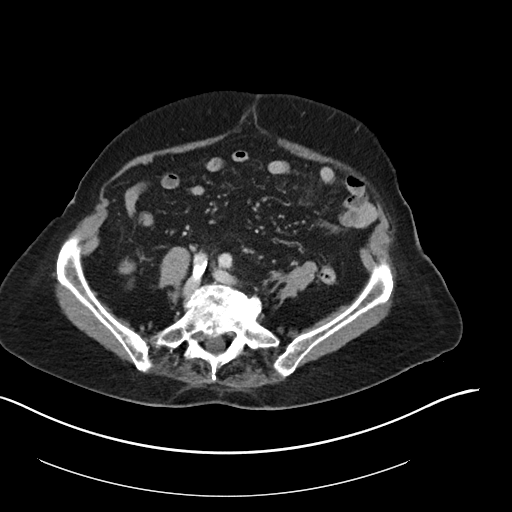
[im 41/81  soft-tissue]
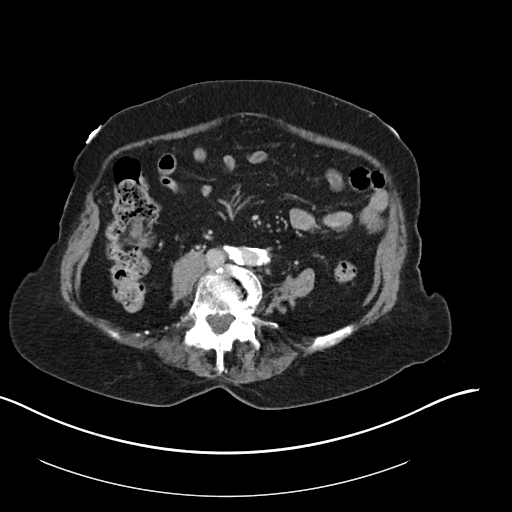
[im 45/81  soft-tissue]
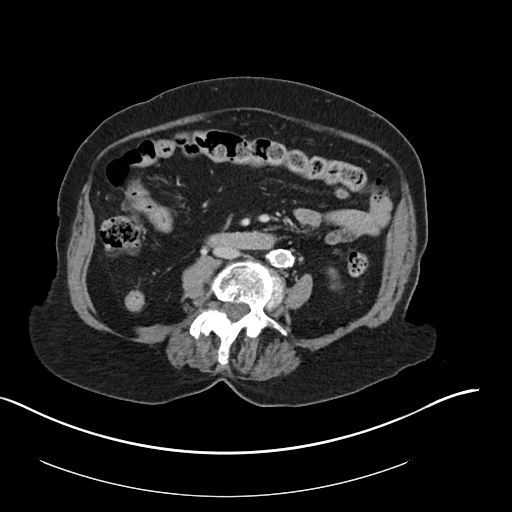
[im 54/81  soft-tissue]
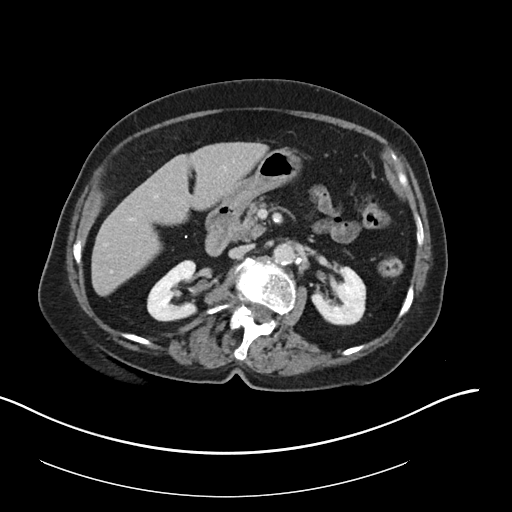
[im 54/81  bone]
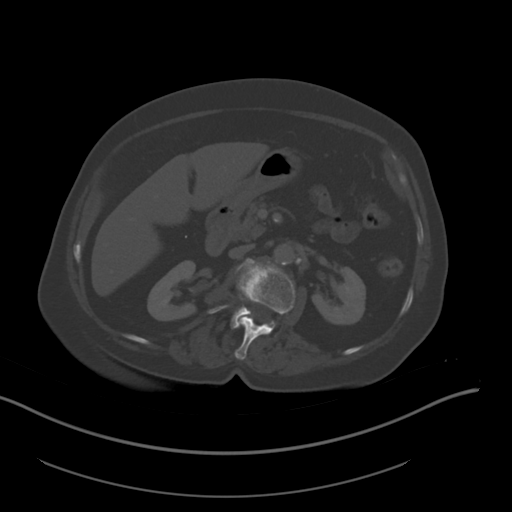
[im 58/81  soft-tissue]
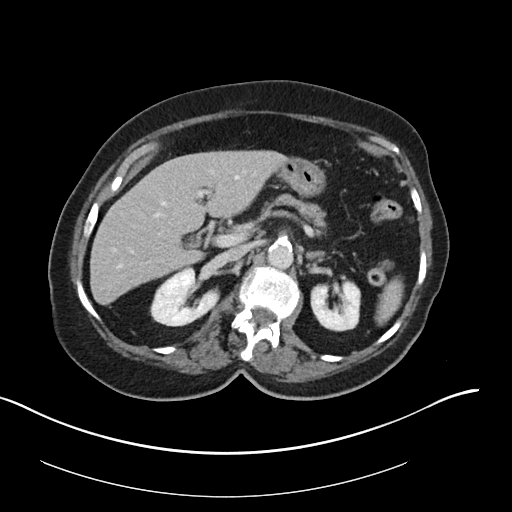
[im 63/81  soft-tissue]
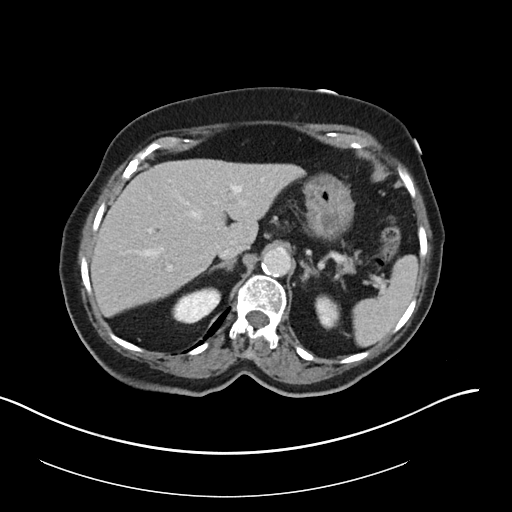
[im 72/81  soft-tissue]
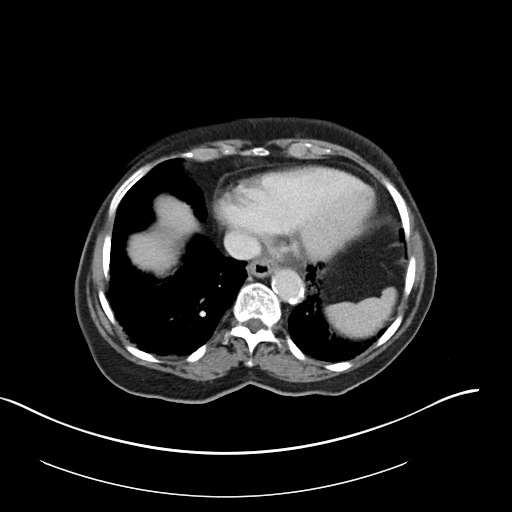
[im 76/81  soft-tissue]
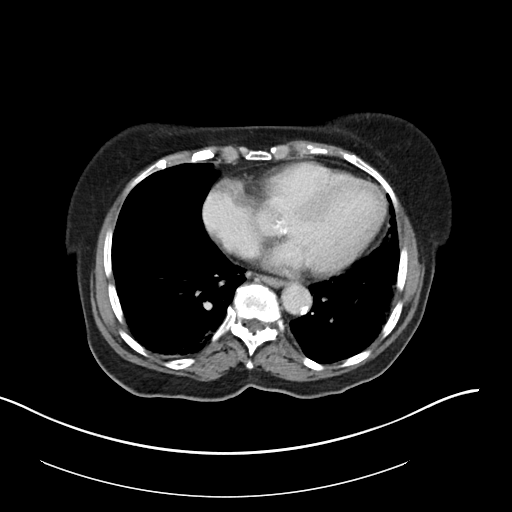

[Series 7: coronal soft tissue · coronal · 0.71mm/px · 3 of 101 slices shown]
[im 34/101  soft-tissue]
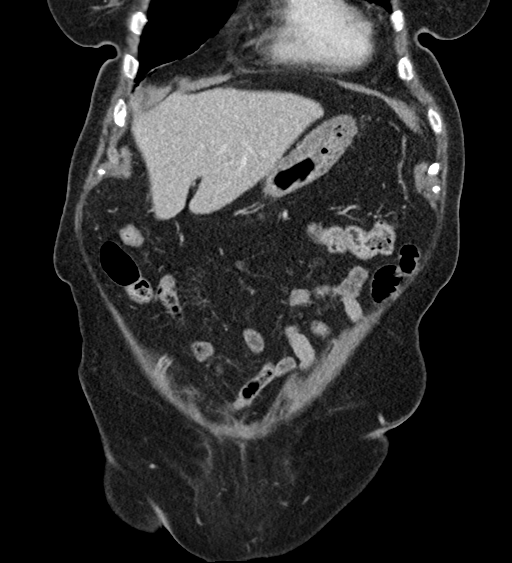
[im 45/101  soft-tissue]
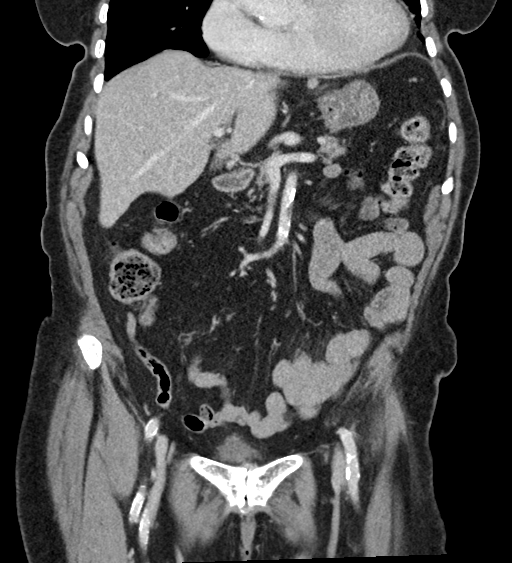
[im 56/101  soft-tissue]
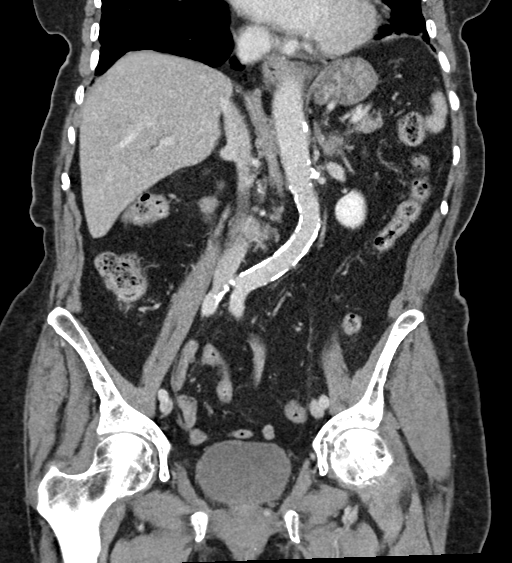

[16 of 46 positions shown; findings below may reference images not displayed]

FINDINGS: Lower chest: Mild multi chamber cardiomegaly. Tortuosity and
atherosclerosis of the descending thoracic aorta. Multifocal
atelectasis at the lung bases, most significant dependently. No
pleural fluid.

Hepatobiliary: A few tiny a subcentimeter hepatic hypodensities are
too small to characterize but likely small cysts.
Postcholecystectomy without biliary dilatation.

Pancreas: Parenchymal atrophy. No ductal dilatation or inflammation.

Spleen: Normal in size without focal abnormality.

Adrenals/Urinary Tract: No adrenal nodule. No hydronephrosis or
perinephric edema. Homogeneous enhancement and symmetric excretion
on delayed phase imaging. Urinary bladder is physiologically
distended.

Stomach/Bowel: The stomach is decompressed. No bowel dilatation to
suggest obstruction. No bowel wall thickening or inflammation. Small
stool burden throughout the colon. The appendix is not visualized,
no pericecal or right lower quadrant inflammation.

Vascular/Lymphatic: Aortic and branch atherosclerosis and
tortuosity. No aneurysm. No acute vascular findings. Small
perigastric node in the upper abdomen measure 7 mm image 14 series
4. No retroperitoneal, mesenteric or pelvic adenopathy.

Reproductive: Status post hysterectomy. No adnexal masses.

Other: No free air, free fluid, or intra-abdominal fluid collection.
Tiny fat containing umbilical hernia without inflammation.

Musculoskeletal: Moderate scoliosis and associated degenerative
change in the spine. There are no acute or suspicious osseous
abnormalities.
IMPRESSION: 1. No acute abnormality or explanation for patient's symptoms.
2. Postcholecystectomy and hysterectomy.
3. Aortic and branch atherosclerosis without aneurysm.

## 2019-09-21 DIAGNOSIS — R109 Unspecified abdominal pain: Secondary | ICD-10-CM | POA: Diagnosis not present

## 2021-09-11 DEATH — deceased
